# Patient Record
Sex: Female | Born: 1970 | Race: White | Hispanic: No | Marital: Married
Health system: Southern US, Community
[De-identification: ages and names within clinical notes are randomized; demographics above are authoritative.]

---

## 2005-04-14 ENCOUNTER — Ambulatory Visit: Payer: Self-pay | Admitting: Family Medicine

## 2006-04-07 ENCOUNTER — Ambulatory Visit: Payer: Self-pay | Admitting: Obstetrics and Gynecology

## 2007-08-21 ENCOUNTER — Ambulatory Visit: Payer: Self-pay

## 2007-10-11 ENCOUNTER — Ambulatory Visit: Payer: Self-pay | Admitting: Family Medicine

## 2009-04-08 ENCOUNTER — Ambulatory Visit: Payer: Self-pay

## 2010-06-02 ENCOUNTER — Ambulatory Visit: Payer: Self-pay | Admitting: Nurse Practitioner

## 2011-07-26 ENCOUNTER — Ambulatory Visit: Payer: Self-pay | Admitting: Nurse Practitioner

## 2012-09-27 ENCOUNTER — Encounter: Payer: Self-pay | Admitting: Obstetrics and Gynecology

## 2013-05-23 ENCOUNTER — Ambulatory Visit: Payer: Self-pay | Admitting: Internal Medicine

## 2013-12-12 ENCOUNTER — Emergency Department: Payer: Self-pay | Admitting: Internal Medicine

## 2013-12-12 LAB — COMPREHENSIVE METABOLIC PANEL
Anion Gap: 3 — ABNORMAL LOW (ref 7–16)
BUN: 14 mg/dL (ref 7–18)
EGFR (African American): >60
Osmolality: 281 (ref 275–301)
SGOT(AST): 18 U/L (ref 15–37)
SGPT (ALT): 22 U/L (ref 12–78)
Sodium: 141 mmol/L (ref 136–145)
Total Protein: 7.1 g/dL (ref 6.4–8.2)

## 2013-12-12 LAB — CBC
HGB: 13.1 g/dL (ref 12.0–16.0)
Platelet: 196 10*3/uL (ref 150–440)
RBC: 4.97 10*6/uL (ref 3.80–5.20)
RDW: 14.9 % — ABNORMAL HIGH (ref 11.5–14.5)
WBC: 7.7 10*3/uL (ref 3.6–11.0)

## 2013-12-12 LAB — URINALYSIS, COMPLETE
Bacteria: NONE SEEN
Bilirubin,UR: NEGATIVE
Glucose,UR: NEGATIVE mg/dL (ref 0–75)
Hyaline Cast: 7
Ketone: NEGATIVE
Nitrite: NEGATIVE
Ph: 5 (ref 4.5–8.0)
Protein: 100
RBC,UR: 665 /HPF (ref 0–5)
Specific Gravity: 1.02 (ref 1.003–1.030)
Squamous Epithelial: 2

## 2013-12-12 LAB — LIPASE, BLOOD: Lipase: 123 U/L

## 2015-02-18 ENCOUNTER — Ambulatory Visit: Payer: Self-pay | Admitting: Family Medicine

## 2016-02-18 ENCOUNTER — Other Ambulatory Visit: Payer: Self-pay | Admitting: Family Medicine

## 2016-02-18 DIAGNOSIS — Z1231 Encounter for screening mammogram for malignant neoplasm of breast: Secondary | ICD-10-CM

## 2016-02-24 ENCOUNTER — Ambulatory Visit
Admission: RE | Admit: 2016-02-24 | Discharge: 2016-02-24 | Disposition: A | Discharge status: Home / Self Care | Payer: 59 | Source: Ambulatory Visit | Attending: Family Medicine | Admitting: Family Medicine

## 2016-02-24 DIAGNOSIS — Z1231 Encounter for screening mammogram for malignant neoplasm of breast: Secondary | ICD-10-CM

## 2017-02-02 ENCOUNTER — Other Ambulatory Visit: Payer: Self-pay | Admitting: Family Medicine

## 2017-02-02 DIAGNOSIS — Z1231 Encounter for screening mammogram for malignant neoplasm of breast: Secondary | ICD-10-CM

## 2017-02-27 ENCOUNTER — Ambulatory Visit
Admission: RE | Admit: 2017-02-27 | Discharge: 2017-02-27 | Disposition: A | Discharge status: Home / Self Care | Payer: 59 | Source: Ambulatory Visit | Attending: Family Medicine | Admitting: Family Medicine

## 2017-02-27 DIAGNOSIS — Z1231 Encounter for screening mammogram for malignant neoplasm of breast: Secondary | ICD-10-CM

## 2018-02-13 ENCOUNTER — Other Ambulatory Visit: Payer: Self-pay | Admitting: Family Medicine

## 2018-02-13 DIAGNOSIS — Z1231 Encounter for screening mammogram for malignant neoplasm of breast: Secondary | ICD-10-CM

## 2018-03-19 ENCOUNTER — Other Ambulatory Visit: Payer: Self-pay | Admitting: Family Medicine

## 2018-03-19 ENCOUNTER — Ambulatory Visit
Admission: RE | Admit: 2018-03-19 | Discharge: 2018-03-19 | Disposition: A | Discharge status: Home / Self Care | Payer: Managed Care, Other (non HMO) | Source: Ambulatory Visit | Attending: Family Medicine | Admitting: Family Medicine

## 2018-03-19 ENCOUNTER — Encounter (INDEPENDENT_AMBULATORY_CARE_PROVIDER_SITE_OTHER): Payer: Self-pay

## 2018-03-19 DIAGNOSIS — Z1231 Encounter for screening mammogram for malignant neoplasm of breast: Secondary | ICD-10-CM

## 2019-02-07 ENCOUNTER — Other Ambulatory Visit: Payer: Self-pay | Admitting: Family Medicine

## 2019-02-07 DIAGNOSIS — Z1231 Encounter for screening mammogram for malignant neoplasm of breast: Secondary | ICD-10-CM

## 2019-03-21 ENCOUNTER — Ambulatory Visit: Payer: Managed Care, Other (non HMO)

## 2019-04-23 ENCOUNTER — Ambulatory Visit: Payer: Managed Care, Other (non HMO)

## 2019-06-19 ENCOUNTER — Other Ambulatory Visit: Payer: Self-pay

## 2019-06-19 ENCOUNTER — Ambulatory Visit
Admission: RE | Admit: 2019-06-19 | Discharge: 2019-06-19 | Disposition: A | Discharge status: Home / Self Care | Payer: 59 | Source: Ambulatory Visit | Attending: Family Medicine | Admitting: Family Medicine

## 2019-06-19 ENCOUNTER — Encounter (INDEPENDENT_AMBULATORY_CARE_PROVIDER_SITE_OTHER): Payer: Self-pay

## 2019-06-19 DIAGNOSIS — Z1231 Encounter for screening mammogram for malignant neoplasm of breast: Secondary | ICD-10-CM | POA: Diagnosis not present

## 2019-06-20 ENCOUNTER — Other Ambulatory Visit: Payer: Self-pay | Admitting: Family Medicine

## 2019-06-20 DIAGNOSIS — R928 Other abnormal and inconclusive findings on diagnostic imaging of breast: Secondary | ICD-10-CM

## 2019-07-01 ENCOUNTER — Other Ambulatory Visit: Payer: Self-pay

## 2019-07-01 ENCOUNTER — Ambulatory Visit
Admission: RE | Admit: 2019-07-01 | Discharge: 2019-07-01 | Disposition: A | Discharge status: Home / Self Care | Payer: 59 | Source: Ambulatory Visit | Attending: Family Medicine | Admitting: Family Medicine

## 2019-07-01 DIAGNOSIS — R928 Other abnormal and inconclusive findings on diagnostic imaging of breast: Secondary | ICD-10-CM | POA: Insufficient documentation

## 2019-07-02 ENCOUNTER — Other Ambulatory Visit: Payer: Self-pay | Admitting: Family Medicine

## 2019-07-02 DIAGNOSIS — R928 Other abnormal and inconclusive findings on diagnostic imaging of breast: Secondary | ICD-10-CM

## 2019-07-03 ENCOUNTER — Ambulatory Visit
Admission: RE | Admit: 2019-07-03 | Discharge: 2019-07-03 | Disposition: A | Discharge status: Home / Self Care | Payer: 59 | Source: Ambulatory Visit | Attending: Family Medicine | Admitting: Family Medicine

## 2019-07-03 ENCOUNTER — Other Ambulatory Visit: Payer: Self-pay

## 2019-07-03 DIAGNOSIS — R928 Other abnormal and inconclusive findings on diagnostic imaging of breast: Secondary | ICD-10-CM | POA: Diagnosis not present

## 2019-07-03 HISTORY — PX: BREAST BIOPSY: SHX20

## 2019-07-05 LAB — SURGICAL PATHOLOGY

## 2020-01-02 ENCOUNTER — Other Ambulatory Visit: Payer: Self-pay

## 2020-01-02 DIAGNOSIS — N644 Mastodynia: Secondary | ICD-10-CM

## 2020-01-08 ENCOUNTER — Other Ambulatory Visit: Payer: Self-pay

## 2020-01-08 DIAGNOSIS — N644 Mastodynia: Secondary | ICD-10-CM

## 2020-02-04 ENCOUNTER — Ambulatory Visit
Admission: RE | Admit: 2020-02-04 | Discharge: 2020-02-04 | Disposition: A | Discharge status: Home / Self Care | Payer: 59 | Source: Ambulatory Visit

## 2020-02-04 DIAGNOSIS — N644 Mastodynia: Secondary | ICD-10-CM | POA: Insufficient documentation

## 2020-08-05 ENCOUNTER — Other Ambulatory Visit: Payer: Self-pay | Admitting: Medical Oncology

## 2020-08-05 DIAGNOSIS — Z1231 Encounter for screening mammogram for malignant neoplasm of breast: Secondary | ICD-10-CM

## 2020-08-10 ENCOUNTER — Ambulatory Visit: Payer: Self-pay | Attending: Internal Medicine

## 2020-08-10 DIAGNOSIS — Z23 Encounter for immunization: Secondary | ICD-10-CM

## 2020-08-10 NOTE — Progress Notes (Signed)
   Covid-19 Vaccination Clinic  Name:  MADHURI VACCA    MRN: [id number] DOB: 1971-12-01  08/10/2020  Ms. Bellin was observed post Covid-19 immunization for 15 minutes without incident. She was provided with Vaccine Information Sheet and instruction to access the V-Safe system.   Ms. Kenedy was instructed to call 911 with any severe reactions post vaccine: Marland Kitchen Difficulty breathing  . Swelling of face and throat  . A fast heartbeat  . A bad rash all over body  . Dizziness and weakness   Immunizations Administered    Name Date Dose VIS Date Route   Pfizer COVID-19 Vaccine 08/10/2020  9:27 AM 0.3 mL 02/12/2019 Intramuscular   Manufacturer: [employer], [employer]   Lot: [id number]   NDC: [address]-2

## 2020-08-12 ENCOUNTER — Inpatient Hospital Stay: Admission: RE | Admit: 2020-08-12 | Payer: 59 | Source: Ambulatory Visit

## 2020-08-31 ENCOUNTER — Ambulatory Visit: Payer: 59 | Attending: Critical Care Medicine

## 2020-08-31 ENCOUNTER — Ambulatory Visit: Payer: 59

## 2020-08-31 DIAGNOSIS — Z23 Encounter for immunization: Secondary | ICD-10-CM

## 2020-08-31 NOTE — Progress Notes (Signed)
   Covid-19 Vaccination Clinic  Name:  Sandra Guerrero    MRN: [id number] DOB: October 18, 1971  08/31/2020  [name] was observed post Covid-19 immunization for 15 minutes without incident. She was provided with Vaccine Information Sheet and instruction to access the V-Safe system.   Ms. Bench was instructed to call 911 with any severe reactions post vaccine: Marland Kitchen Difficulty breathing  . Swelling of face and throat  . A fast heartbeat  . A bad rash all over body  . Dizziness and weakness   Immunizations Administered    Name Date Dose VIS Date Route   Pfizer COVID-19 Vaccine 08/31/2020 10:21 AM 0.3 mL 02/12/2019 Intramuscular   Manufacturer: [employer], [employer]   Lot: [id number]   NDC: [address]-2

## 2020-09-23 ENCOUNTER — Ambulatory Visit
Admission: RE | Admit: 2020-09-23 | Discharge: 2020-09-23 | Disposition: A | Discharge status: Home / Self Care | Payer: 59 | Source: Ambulatory Visit | Attending: Medical Oncology | Admitting: Medical Oncology

## 2020-09-23 ENCOUNTER — Other Ambulatory Visit: Payer: Self-pay

## 2020-09-23 DIAGNOSIS — Z1231 Encounter for screening mammogram for malignant neoplasm of breast: Secondary | ICD-10-CM | POA: Insufficient documentation

## 2021-07-23 ENCOUNTER — Other Ambulatory Visit: Payer: Self-pay | Admitting: Family Medicine

## 2021-07-23 DIAGNOSIS — Z1231 Encounter for screening mammogram for malignant neoplasm of breast: Secondary | ICD-10-CM

## 2021-09-28 ENCOUNTER — Other Ambulatory Visit: Payer: Self-pay

## 2021-09-28 ENCOUNTER — Ambulatory Visit
Admission: RE | Admit: 2021-09-28 | Discharge: 2021-09-28 | Disposition: A | Discharge status: Home / Self Care | Payer: 59 | Source: Ambulatory Visit | Attending: Family Medicine | Admitting: Family Medicine

## 2021-09-28 DIAGNOSIS — Z1231 Encounter for screening mammogram for malignant neoplasm of breast: Secondary | ICD-10-CM

## 2022-04-14 ENCOUNTER — Other Ambulatory Visit: Payer: Self-pay | Admitting: Family Medicine

## 2022-04-14 DIAGNOSIS — Z1231 Encounter for screening mammogram for malignant neoplasm of breast: Secondary | ICD-10-CM

## 2022-05-13 IMAGING — MG MM DIGITAL SCREENING BILAT W/ TOMO AND CAD
8 series · 8 of 24 positions shown · non-contrast
Comparison: Previous exam(s).

CLINICAL DATA: Screening.

EXAM:
DIGITAL SCREENING BILATERAL MAMMOGRAM WITH TOMOSYNTHESIS AND CAD
TECHNIQUE: Bilateral screening digital craniocaudal and mediolateral oblique
mammograms were obtained. Bilateral screening digital breast
tomosynthesis was performed. The images were evaluated with
computer-aided detection.

[R CC synth-2D]
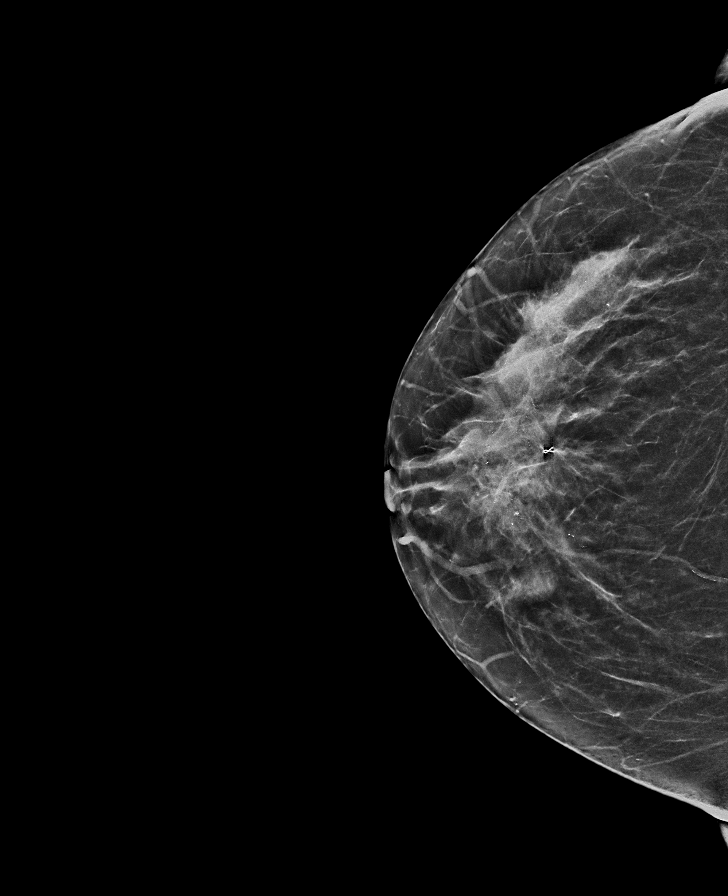

[L MLO synth-2D]
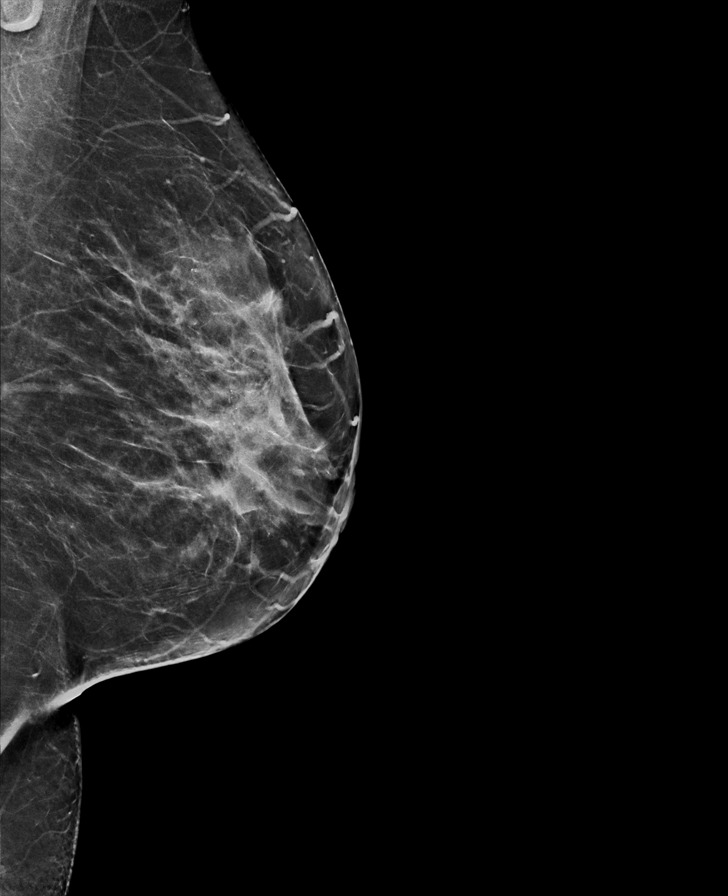

[L CC synth-2D]
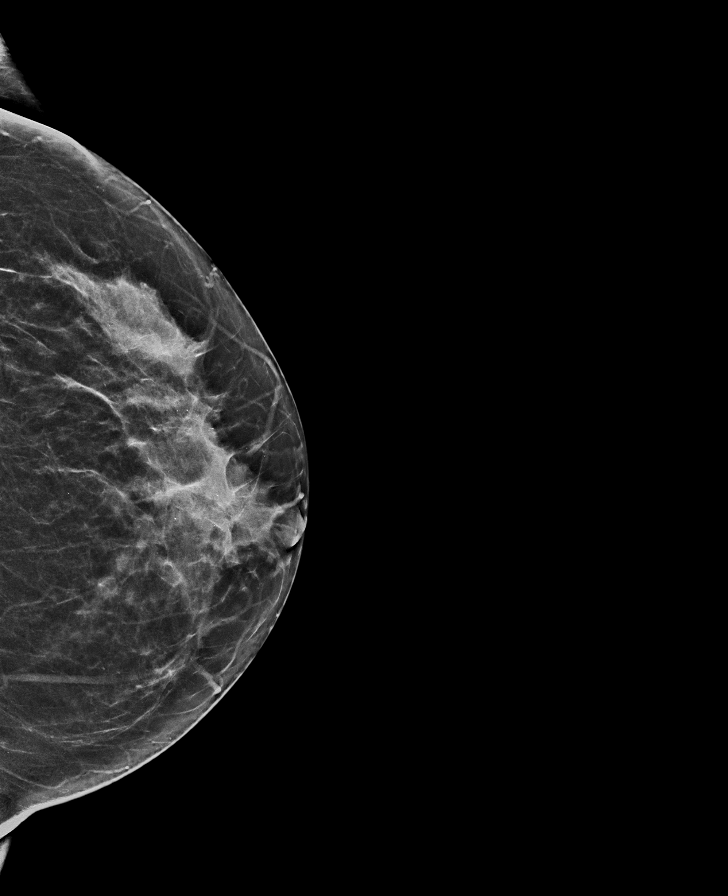

[R MLO synth-2D]
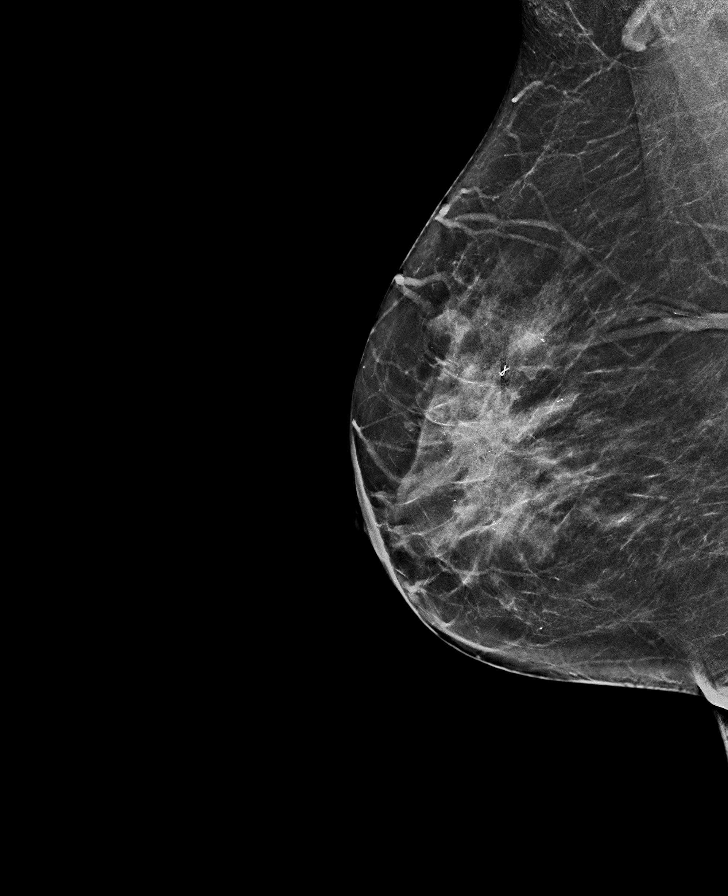

[L MLO tomo · tomo slice 33/66.0]
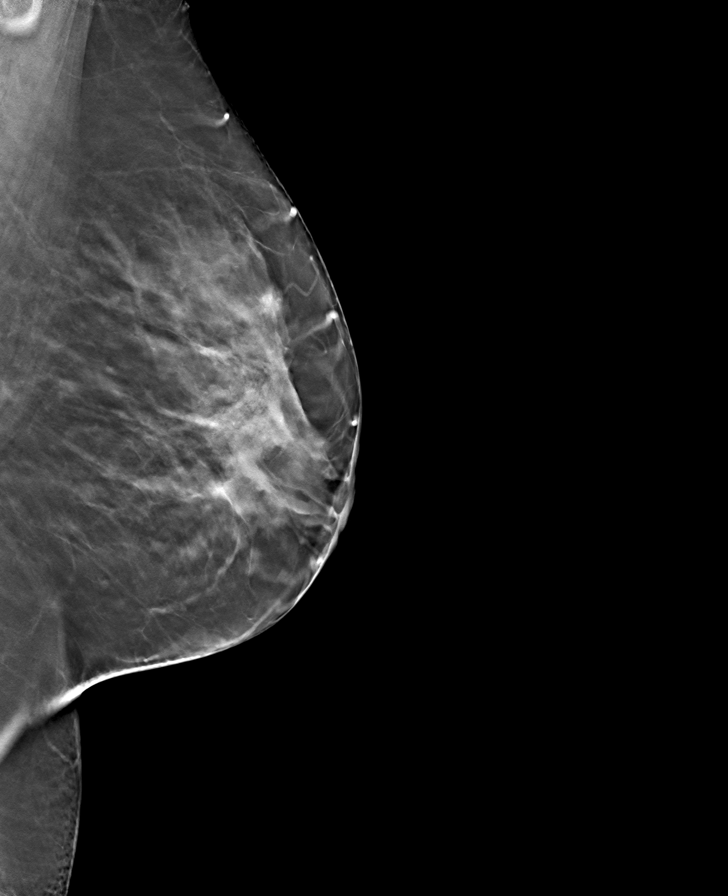

[R CC tomo · tomo slice 30/59.0]
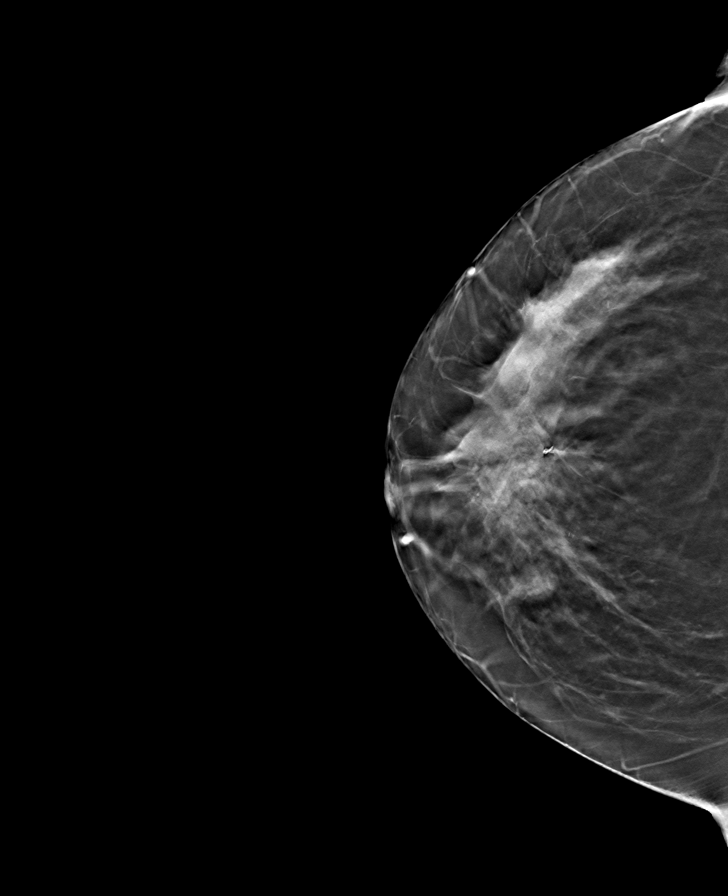

[L CC tomo · tomo slice 31/61.0]
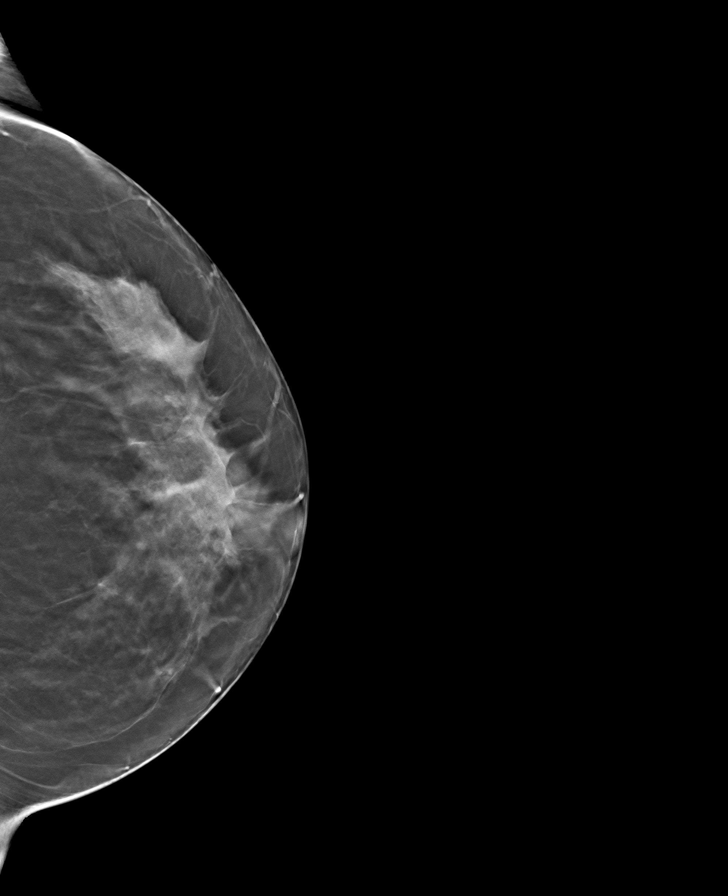

[R MLO tomo · tomo slice 33/65.0]
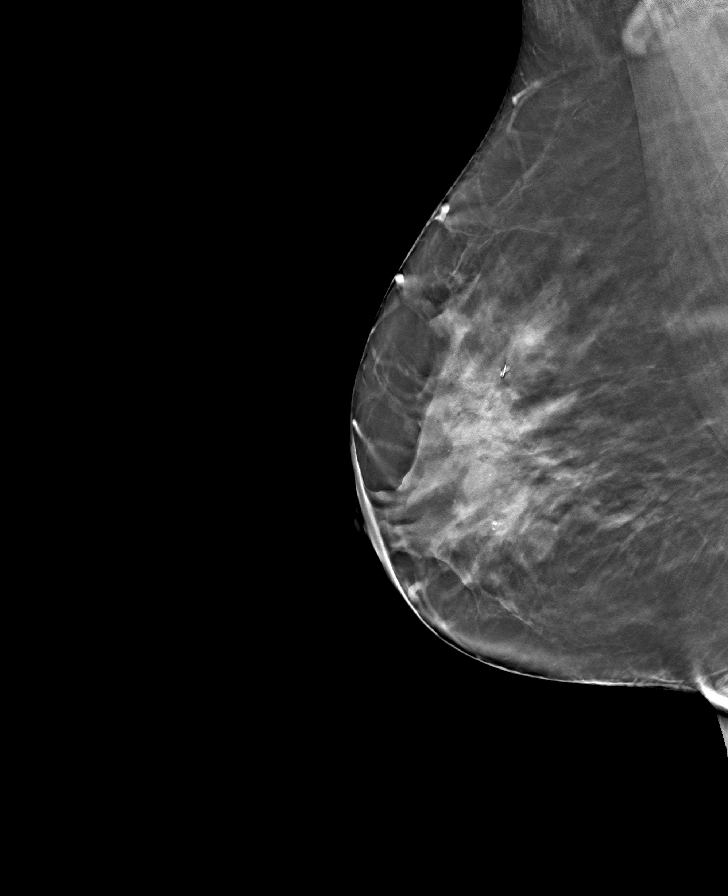

[8 of 24 positions shown; findings below may reference images not displayed]

ACR Breast Density Category c: The breast tissue is heterogeneously
dense, which may obscure small masses.
FINDINGS: There are no findings suspicious for malignancy.
IMPRESSION: No mammographic evidence of malignancy. A result letter of this
screening mammogram will be mailed directly to the patient.

RECOMMENDATION:
Screening mammogram in one year. (Code:[id number])

BI-RADS CATEGORY  1: Negative.

## 2022-09-29 ENCOUNTER — Ambulatory Visit
Admission: RE | Admit: 2022-09-29 | Discharge: 2022-09-29 | Disposition: A | Discharge status: Home / Self Care | Payer: 59 | Source: Ambulatory Visit | Attending: Family Medicine | Admitting: Family Medicine

## 2022-09-29 DIAGNOSIS — Z1231 Encounter for screening mammogram for malignant neoplasm of breast: Secondary | ICD-10-CM | POA: Diagnosis present

## 2022-10-06 ENCOUNTER — Other Ambulatory Visit: Payer: Self-pay | Admitting: Family Medicine

## 2022-10-06 DIAGNOSIS — R928 Other abnormal and inconclusive findings on diagnostic imaging of breast: Secondary | ICD-10-CM

## 2022-10-06 DIAGNOSIS — N63 Unspecified lump in unspecified breast: Secondary | ICD-10-CM

## 2022-10-10 ENCOUNTER — Ambulatory Visit
Admission: RE | Admit: 2022-10-10 | Discharge: 2022-10-10 | Disposition: A | Discharge status: Home / Self Care | Payer: 59 | Source: Ambulatory Visit | Attending: Family Medicine | Admitting: Family Medicine

## 2022-10-10 DIAGNOSIS — N63 Unspecified lump in unspecified breast: Secondary | ICD-10-CM | POA: Insufficient documentation

## 2022-10-10 DIAGNOSIS — R928 Other abnormal and inconclusive findings on diagnostic imaging of breast: Secondary | ICD-10-CM | POA: Diagnosis present

## 2024-03-15 ENCOUNTER — Other Ambulatory Visit: Payer: Self-pay | Admitting: Family Medicine

## 2024-03-15 DIAGNOSIS — Z1231 Encounter for screening mammogram for malignant neoplasm of breast: Secondary | ICD-10-CM

## 2024-03-28 ENCOUNTER — Encounter

## 2024-04-15 ENCOUNTER — Ambulatory Visit
Admission: RE | Admit: 2024-04-15 | Discharge: 2024-04-15 | Disposition: A | Discharge status: Home / Self Care | Payer: Self-pay | Source: Ambulatory Visit | Attending: Family Medicine | Admitting: Family Medicine

## 2024-04-15 DIAGNOSIS — Z1231 Encounter for screening mammogram for malignant neoplasm of breast: Secondary | ICD-10-CM | POA: Insufficient documentation
